# Patient Record
Sex: Male | Born: 1950 | Race: White | Hispanic: No | Marital: Single | Smoking: Never smoker
Health system: Southern US, Community
[De-identification: ages and names within clinical notes are randomized; demographics above are authoritative.]

## PROBLEM LIST (undated history)

## (undated) DIAGNOSIS — R739 Hyperglycemia, unspecified: Secondary | ICD-10-CM

## (undated) DIAGNOSIS — I10 Essential (primary) hypertension: Secondary | ICD-10-CM

## (undated) HISTORY — DX: Essential (primary) hypertension: I10

## (undated) HISTORY — DX: Hyperglycemia, unspecified: R73.9

---

## 2007-08-07 ENCOUNTER — Ambulatory Visit: Payer: Self-pay | Admitting: Family Medicine

## 2007-08-27 ENCOUNTER — Ambulatory Visit: Payer: Self-pay | Admitting: Family Medicine

## 2013-03-17 ENCOUNTER — Ambulatory Visit: Payer: Self-pay | Admitting: Internal Medicine

## 2013-07-13 ENCOUNTER — Encounter: Payer: Self-pay | Admitting: Internal Medicine

## 2013-07-13 ENCOUNTER — Ambulatory Visit (INDEPENDENT_AMBULATORY_CARE_PROVIDER_SITE_OTHER): Payer: 59 | Admitting: Internal Medicine

## 2013-07-13 ENCOUNTER — Encounter (INDEPENDENT_AMBULATORY_CARE_PROVIDER_SITE_OTHER): Payer: Self-pay

## 2013-07-13 VITALS — BP 178/98 | HR 89 | Temp 98.7°F | Ht 65.75 in | Wt 178.0 lb

## 2013-07-13 DIAGNOSIS — R7309 Other abnormal glucose: Secondary | ICD-10-CM

## 2013-07-13 DIAGNOSIS — Z1211 Encounter for screening for malignant neoplasm of colon: Secondary | ICD-10-CM | POA: Insufficient documentation

## 2013-07-13 DIAGNOSIS — R739 Hyperglycemia, unspecified: Secondary | ICD-10-CM

## 2013-07-13 DIAGNOSIS — Z125 Encounter for screening for malignant neoplasm of prostate: Secondary | ICD-10-CM

## 2013-07-13 DIAGNOSIS — R030 Elevated blood-pressure reading, without diagnosis of hypertension: Secondary | ICD-10-CM

## 2013-07-13 NOTE — Progress Notes (Signed)
  Subjective:    Patient ID: Andre Foster, male    DOB: [birth year], 62 y.o.   MRN: [id number]  HPI 62 year old male presents to establish care. He brings record from recent health screening at his wife's work which showed elevated A1c at 6.8% and elevated blood pressure. He has never been diagnosed or treated for diabetes. He has never been treated for elevated blood pressure. He reports he follows a healthy diet and exercises daily by running with his dog. He denies any symptoms of chest pain, palpitations, headache. He denies shortness of breath. He does have a family history of diabetes and heart disease.  No outpatient encounter prescriptions on file as of 07/13/2013.   No facility-administered encounter medications on file as of 07/13/2013.   BP 178/98  Pulse 89  Temp(Src) 98.7 F (37.1 C) (Oral)  Ht 5' 5.75" (1.67 m)  Wt 178 lb (80.74 kg)  BMI 28.95 kg/m2  SpO2 98%  Review of Systems  Constitutional: Negative for fever, chills, activity change, appetite change, fatigue and unexpected weight change.  Eyes: Negative for visual disturbance.  Respiratory: Negative for cough and shortness of breath.   Cardiovascular: Negative for chest pain, palpitations and leg swelling.  Gastrointestinal: Negative for abdominal pain and abdominal distention.  Genitourinary: Negative for dysuria, urgency and difficulty urinating.  Musculoskeletal: Negative for arthralgias and gait problem.  Skin: Negative for color change and rash.  Hematological: Negative for adenopathy.  Psychiatric/Behavioral: Negative for sleep disturbance and dysphoric mood. The patient is not nervous/anxious.        Objective:   Physical Exam  Constitutional: He is oriented to person, place, and time. He appears well-developed and well-nourished. No distress.  HENT:  Head: Normocephalic and atraumatic.  Right Ear: External ear normal.  Left Ear: External ear normal.  Nose: Nose normal.  Mouth/Throat: Oropharynx is  clear and moist. No oropharyngeal exudate.  Eyes: Conjunctivae and EOM are normal. Pupils are equal, round, and reactive to light. Right eye exhibits no discharge. Left eye exhibits no discharge. No scleral icterus.  Neck: Normal range of motion. Neck supple. No tracheal deviation present. No thyromegaly present.  Cardiovascular: Normal rate, regular rhythm and normal heart sounds.  Exam reveals no gallop and no friction rub.   No murmur heard. Pulmonary/Chest: Effort normal and breath sounds normal. No respiratory distress. He has no wheezes. He has no rales. He exhibits no tenderness.  Abdominal: Soft. Bowel sounds are normal. He exhibits no distension. There is no tenderness. There is no rebound.  Musculoskeletal: Normal range of motion. He exhibits no edema.  Lymphadenopathy:    He has no cervical adenopathy.  Neurological: He is alert and oriented to person, place, and time. No cranial nerve deficit. Coordination normal.  Skin: Skin is warm and dry. No rash noted. He is not diaphoretic. No erythema. No pallor.  Psychiatric: He has a normal mood and affect. His behavior is normal. Judgment and thought content normal.          Assessment & Plan:

## 2013-07-13 NOTE — Assessment & Plan Note (Signed)
BP Readings from Last 3 Encounters:  07/13/13 178/98   BP elevated today. Will have pt monitor at home and email with readings. Will check renal function with labs today. If persistently elevated >140/90, we discussed starting lisinopril.

## 2013-07-13 NOTE — Patient Instructions (Signed)
Please check blood pressure at home 1-2 times per week. Email me with readings.  Follow up 2-4 weeks.

## 2013-07-13 NOTE — Assessment & Plan Note (Signed)
Recent employee health screening showed elevated A1c of 6.8%. Will recheck fasting blood sugar and A1c with labs. Encouraged healthy diet, low in processed sugars. Encouraged continued regular physical activity.

## 2013-07-14 ENCOUNTER — Telehealth: Payer: Self-pay | Admitting: *Deleted

## 2013-07-14 DIAGNOSIS — R17 Unspecified jaundice: Secondary | ICD-10-CM

## 2013-07-14 LAB — COMPREHENSIVE METABOLIC PANEL
ALT: 14 IU/L (ref 0–44)
AST: 14 IU/L (ref 0–40)
Albumin/Globulin Ratio: 2.4 (ref 1.1–2.5)
Creatinine, Ser: 1.17 mg/dL (ref 0.76–1.27)
GFR calc Af Amer: 77 mL/min/{1.73_m2} (ref >59)
GFR calc non Af Amer: 66 mL/min/{1.73_m2} (ref >59)
Globulin, Total: 2 g/dL (ref 1.5–4.5)
Potassium: 4.7 mmol/L (ref 3.5–5.2)
Sodium: 139 mmol/L (ref 134–144)
Total Bilirubin: 1.9 mg/dL — ABNORMAL HIGH (ref 0.0–1.2)

## 2013-07-14 LAB — PSA, TOTAL AND FREE
PSA, Free Pct: 20.6 %
PSA, Free: 0.66 ng/mL

## 2013-07-14 LAB — LIPID PANEL
Chol/HDL Ratio: 6.5 ratio units — ABNORMAL HIGH (ref 0.0–5.0)
Cholesterol, Total: 240 mg/dL — ABNORMAL HIGH (ref 100–199)
LDL Calculated: 170 mg/dL — ABNORMAL HIGH (ref 0–99)
Triglycerides: 165 mg/dL — ABNORMAL HIGH (ref 0–149)
VLDL Cholesterol Cal: 33 mg/dL (ref 5–40)

## 2013-07-14 LAB — TSH: TSH: 3.78 u[IU]/mL (ref 0.450–4.500)

## 2013-07-14 LAB — MICROALBUMIN / CREATININE URINE RATIO
MICROALB/CREAT RATIO: 8.6 mg/g creat (ref 0.0–30.0)
Microalbumin, Urine: 13.2 ug/mL (ref 0.0–17.0)

## 2013-07-14 MED ORDER — ATORVASTATIN CALCIUM 20 MG PO TABS: 20.0000 mg | ORAL_TABLET | Freq: Every day | ORAL | Status: AC

## 2013-07-14 MED ORDER — METFORMIN HCL 500 MG PO TABS: 500.0000 mg | ORAL_TABLET | Freq: Two times a day (BID) | ORAL | Status: DC

## 2013-07-14 NOTE — Telephone Encounter (Signed)
Message copied by Theola Sequin on Wed Jul 14, 2013  9:49 AM ------      Message from: Ronna Polio A      Created: Wed Jul 14, 2013  7:12 AM       Labs show elevated blood sugars consistent with diabetes. I would like to have pt check blood sugars at home 1-2 times daily. We will need to call in a glucometer, test strips and lancets. Onetouch Ultra is fine. I would recommend that we start Metformin 500mg  twice daily. We will need to call this in as well.      Labs also show elevated cholesterol. I would like to start Atorvastatin 20mg  daily.             Finally, labs show elevated bilirubin. I would like to order RUQ ultrasound to evaluate the liver and pancreas. ------

## 2013-07-14 NOTE — Telephone Encounter (Signed)
Patient informed of results and he already has a glucometer at home, no need to send prescription for glucometer. Patient is ok with referral for U/S. Metformin and Atorvastatin sent to [facility]

## 2013-07-15 ENCOUNTER — Telehealth: Payer: Self-pay | Admitting: Internal Medicine

## 2013-07-15 ENCOUNTER — Ambulatory Visit: Payer: Self-pay | Admitting: Internal Medicine

## 2013-07-15 MED ORDER — LISINOPRIL 10 MG PO TABS: 10.0000 mg | ORAL_TABLET | Freq: Every day | ORAL | Status: DC

## 2013-07-15 NOTE — Telephone Encounter (Signed)
We can start Lisinopril 10mg  daily. #90 with 3 refills. He will need to have BMP in 1 week after starting this medication. He should monitor BP on the medication and email with readings.

## 2013-07-15 NOTE — Telephone Encounter (Signed)
Pt was recently seen and Dr. Darrick Huntsman wanted his b/p reading before she prescribed the medication. Pt came by and says his b/p reading is 186/96. Pt uses [facility] on [address].

## 2013-07-15 NOTE — Telephone Encounter (Signed)
Patient informed and verbalized understanding. Lab appt scheduled, patient aware it is a lab appointment to have labs drawn not to have his BP checked. Also need to check BP once a day or every other day.

## 2013-07-15 NOTE — Telephone Encounter (Signed)
I do not see a visit other than provider with in chart please advise.

## 2013-07-15 NOTE — Telephone Encounter (Signed)
Spoke with patient and he stated he took his BP while out and it was as listed below. Explained to patient usually patients monitor it for a couple days before getting medication treatment. However patient would like some BP medication since it was high today after he had his ultrasound and when he was here for his visit. Please advise.

## 2013-07-16 ENCOUNTER — Encounter: Payer: Self-pay | Admitting: Internal Medicine

## 2013-07-20 ENCOUNTER — Encounter: Payer: Self-pay | Admitting: Internal Medicine

## 2013-07-22 ENCOUNTER — Encounter: Payer: Self-pay | Admitting: Internal Medicine

## 2013-07-22 ENCOUNTER — Other Ambulatory Visit: Payer: Self-pay | Admitting: *Deleted

## 2013-07-22 ENCOUNTER — Telehealth: Payer: Self-pay | Admitting: *Deleted

## 2013-07-22 DIAGNOSIS — I10 Essential (primary) hypertension: Secondary | ICD-10-CM

## 2013-07-22 NOTE — Telephone Encounter (Signed)
BMP 401.9 

## 2013-07-22 NOTE — Telephone Encounter (Signed)
Pt is coming in for labs tomorrow 10.31.2014 what labs and dx?

## 2013-07-23 ENCOUNTER — Other Ambulatory Visit (INDEPENDENT_AMBULATORY_CARE_PROVIDER_SITE_OTHER): Payer: 59

## 2013-07-23 DIAGNOSIS — I10 Essential (primary) hypertension: Secondary | ICD-10-CM

## 2013-07-23 NOTE — Addendum Note (Signed)
Addended by: Montine Circle D on: 07/23/2013 11:40 AM   Modules accepted: Orders

## 2013-07-24 LAB — BASIC METABOLIC PANEL
BUN/Creatinine Ratio: 18 (ref 10–22)
CO2: 26 mmol/L (ref 18–29)
Calcium: 9.4 mg/dL (ref 8.6–10.2)
Chloride: 100 mmol/L (ref 97–108)
GFR calc Af Amer: 95 mL/min/{1.73_m2} (ref >59)
GFR calc non Af Amer: 82 mL/min/{1.73_m2} (ref >59)
Potassium: 4.3 mmol/L (ref 3.5–5.2)
Sodium: 141 mmol/L (ref 134–144)

## 2013-07-26 ENCOUNTER — Encounter: Payer: Self-pay | Admitting: Internal Medicine

## 2013-07-28 ENCOUNTER — Encounter: Payer: Self-pay | Admitting: Internal Medicine

## 2013-07-29 ENCOUNTER — Other Ambulatory Visit: Payer: Self-pay

## 2013-07-30 ENCOUNTER — Encounter: Payer: Self-pay | Admitting: Internal Medicine

## 2013-07-31 ENCOUNTER — Encounter: Payer: Self-pay | Admitting: Internal Medicine

## 2013-08-01 MED ORDER — LISINOPRIL 40 MG PO TABS: 40.0000 mg | ORAL_TABLET | Freq: Every day | ORAL | Status: DC

## 2013-08-06 ENCOUNTER — Encounter: Payer: Self-pay | Admitting: Internal Medicine

## 2013-08-12 ENCOUNTER — Other Ambulatory Visit (INDEPENDENT_AMBULATORY_CARE_PROVIDER_SITE_OTHER): Payer: 59

## 2013-08-12 ENCOUNTER — Telehealth: Payer: Self-pay | Admitting: *Deleted

## 2013-08-12 DIAGNOSIS — I10 Essential (primary) hypertension: Secondary | ICD-10-CM

## 2013-08-12 DIAGNOSIS — E785 Hyperlipidemia, unspecified: Secondary | ICD-10-CM

## 2013-08-12 NOTE — Telephone Encounter (Signed)
Actually, can you draw CMP and lipids 401.9 and 272.4

## 2013-08-12 NOTE — Telephone Encounter (Signed)
What labs and dx?  

## 2013-08-12 NOTE — Telephone Encounter (Signed)
BMP 401.9 

## 2013-08-13 LAB — COMPREHENSIVE METABOLIC PANEL
ALT: 20 IU/L (ref 0–44)
AST: 19 IU/L (ref 0–40)
Albumin/Globulin Ratio: 2.3 (ref 1.1–2.5)
Albumin: 4.6 g/dL (ref 3.6–4.8)
Alkaline Phosphatase: 74 IU/L (ref 39–117)
BUN/Creatinine Ratio: 24 — ABNORMAL HIGH (ref 10–22)
GFR calc Af Amer: 82 mL/min/{1.73_m2} (ref >59)
GFR calc non Af Amer: 71 mL/min/{1.73_m2} (ref >59)
Potassium: 4.6 mmol/L (ref 3.5–5.2)
Sodium: 139 mmol/L (ref 134–144)
Total Bilirubin: 1.2 mg/dL (ref 0.0–1.2)

## 2013-08-13 LAB — LIPID PANEL
Triglycerides: 104 mg/dL (ref 0–149)
VLDL Cholesterol Cal: 21 mg/dL (ref 5–40)

## 2013-09-14 ENCOUNTER — Encounter: Payer: Self-pay | Admitting: Internal Medicine

## 2013-10-28 ENCOUNTER — Other Ambulatory Visit: Payer: Self-pay | Admitting: *Deleted

## 2013-10-28 MED ORDER — METFORMIN HCL 500 MG PO TABS: 500.0000 mg | ORAL_TABLET | Freq: Two times a day (BID) | ORAL | Status: DC

## 2013-10-28 MED ORDER — LISINOPRIL 40 MG PO TABS: 40.0000 mg | ORAL_TABLET | Freq: Every day | ORAL | Status: AC

## 2013-11-05 ENCOUNTER — Telehealth: Payer: Self-pay | Admitting: Internal Medicine

## 2013-11-05 NOTE — Telephone Encounter (Signed)
I called and spoke with Mardy at [facility] in the billing department. I asked her to help me with the patients bill his acct number there is [id number]. It was for an ultra sound he had done on 07/15/2013. She is sending Dewayne Hatchnn an email with my contact information on it so we can discuss the dx codes.

## 2013-11-05 NOTE — Telephone Encounter (Signed)
I called to let patient know that I had contacted [facility] on his behalf and was waiting for them to get back with me.

## 2014-01-07 NOTE — Telephone Encounter (Signed)
Below is what I received from Ann at [facility] patient accounts: Please let me know if I need to send her anything else.  Hi Carollee HerterShannon,  I received an email from our phone team stating that you had concerns about the diagnosis code for the above patient.  The claim was coded as 277.4 - Ultrasound - RUQ pain - elevated bilirubin.  I double checked the image of the physicians order and thats exactly how it reads.  I hope this helps.  If you have any further questions, just let me know.  Thanks!  Milus GlazierAnn Shebester Ayr - Patient Accounts

## 2014-01-07 NOTE — Telephone Encounter (Signed)
No. I don't think any additional information is needed.

## 2014-02-19 ENCOUNTER — Other Ambulatory Visit: Payer: Self-pay | Admitting: Internal Medicine

## 2014-06-14 ENCOUNTER — Other Ambulatory Visit: Payer: Self-pay | Admitting: Internal Medicine

## 2014-06-16 NOTE — Telephone Encounter (Signed)
Mailed unread message to pt  

## 2015-04-11 ENCOUNTER — Other Ambulatory Visit: Payer: Self-pay | Admitting: Internal Medicine

## 2015-04-11 NOTE — Telephone Encounter (Signed)
Last OV 10.21.2014.  Please advise refill

## 2015-04-11 NOTE — Telephone Encounter (Signed)
Spoke with pt advised appoint was needed for medication refill.  Pt declined appoint.  He states his wife works for [employer] and he and his wife have their wellness visits done there and he does not want to come in for an appoint.  He states he just wants his BP medication.  I attempted to explain that for maintenance medications he should be seen at least every 6 months.

## 2015-04-11 NOTE — Telephone Encounter (Signed)
Must be seen at least once per year for ongoing BP medication.

## 2015-04-13 ENCOUNTER — Other Ambulatory Visit: Payer: Self-pay | Admitting: Internal Medicine

## 2015-10-12 IMAGING — US US ABDOMEN LIMITED SLG ORGAN/ASCITES
1 series · 14 of 25 positions shown · non-contrast
Comparison: none

REASON FOR EXAM: RUQ Pain Elevated Bilirubin
COMMENTS:

[Series 1: us abdomen limited slg organ/ascites · 0.28mm/px · 14 of 51 slices shown]
[im 1/51]
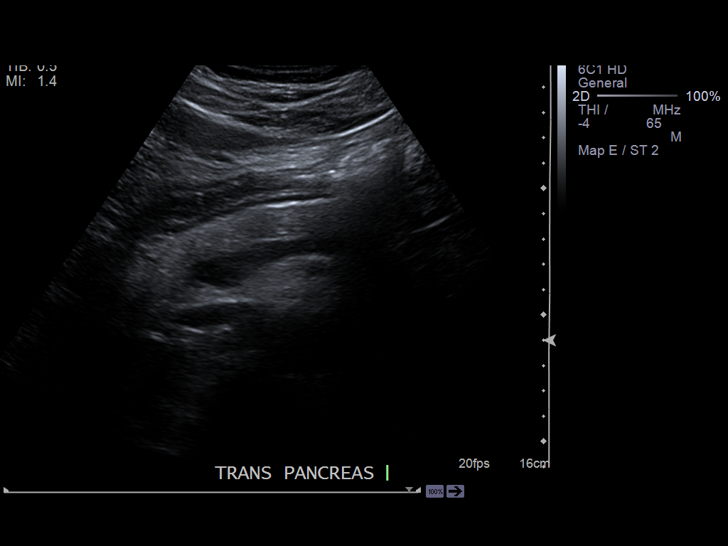
[im 5/51]
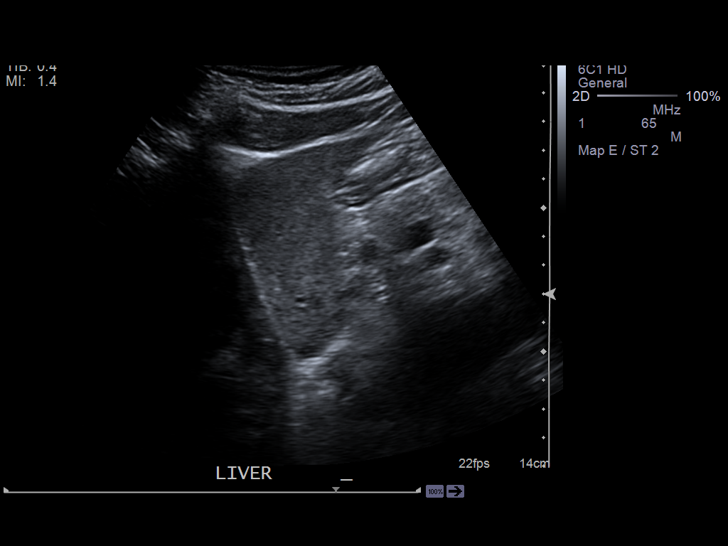
[im 9/51]
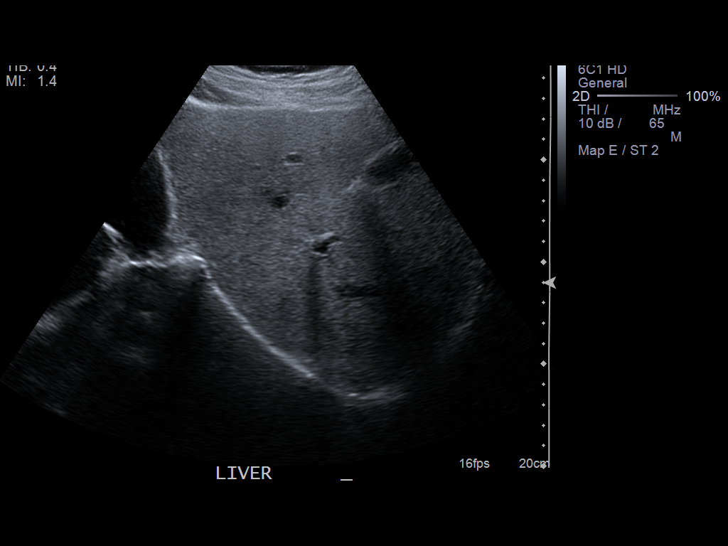
[im 13/51]
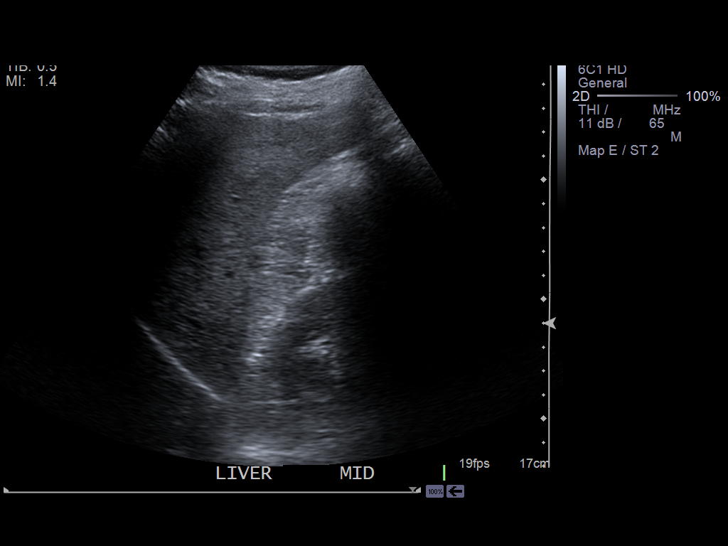
[im 17/51]
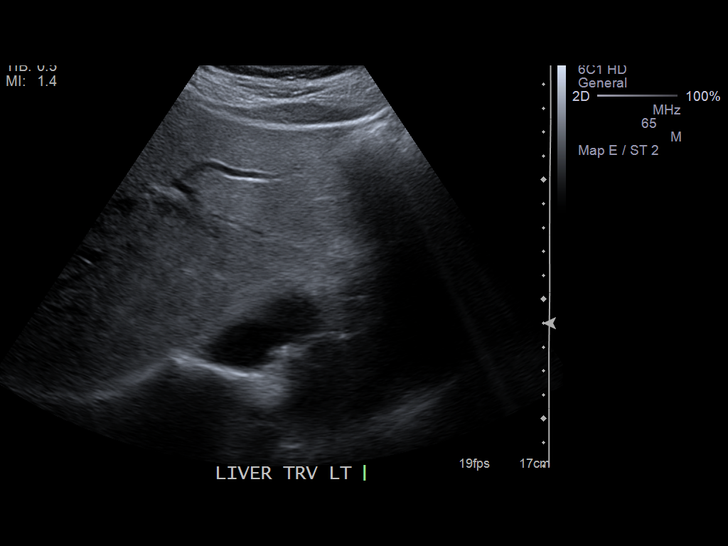
[im 19/51]
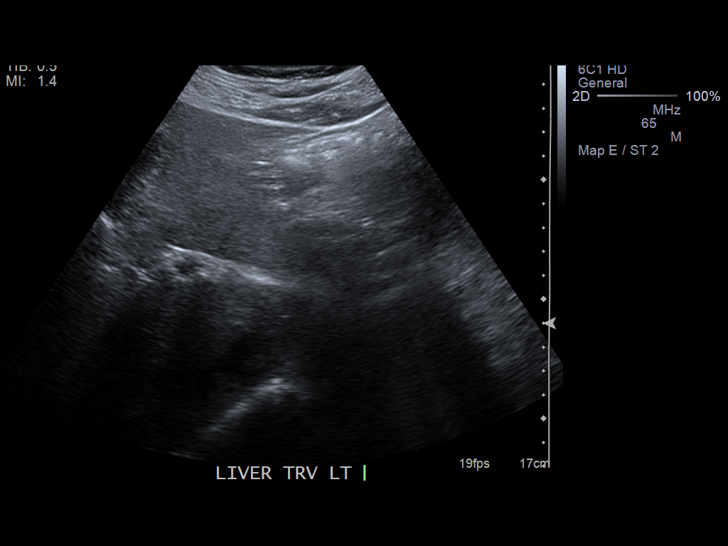
[im 23/51]
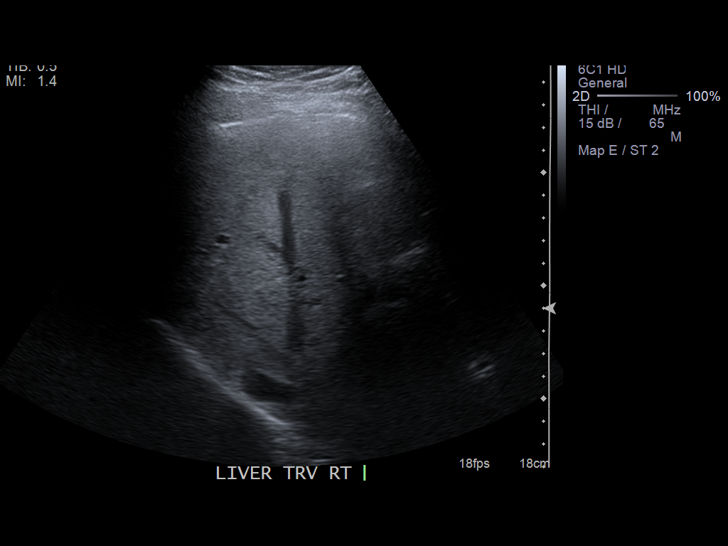
[im 28/51]
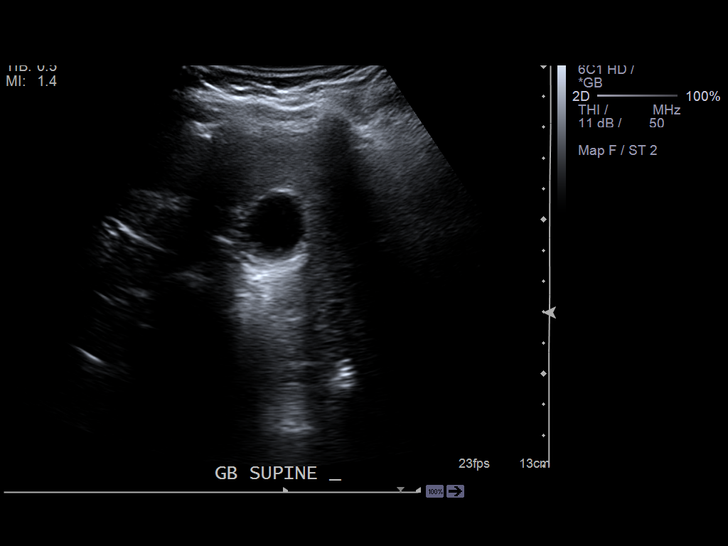
[im 32/51]
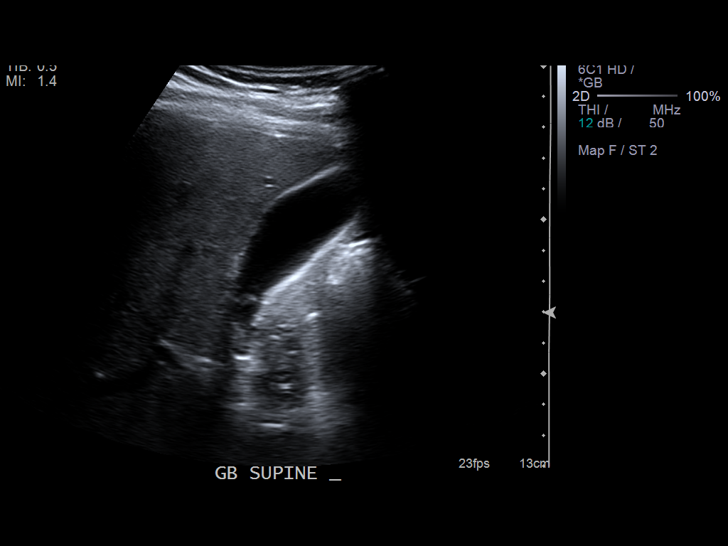
[im 34/51]
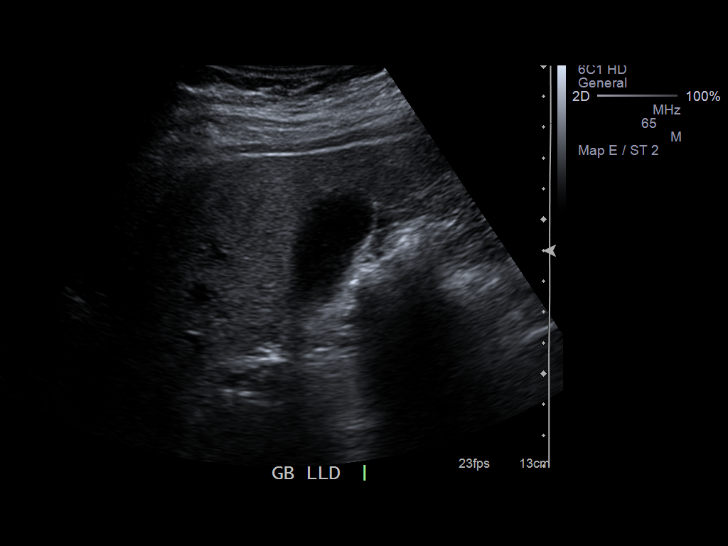
[im 38/51]
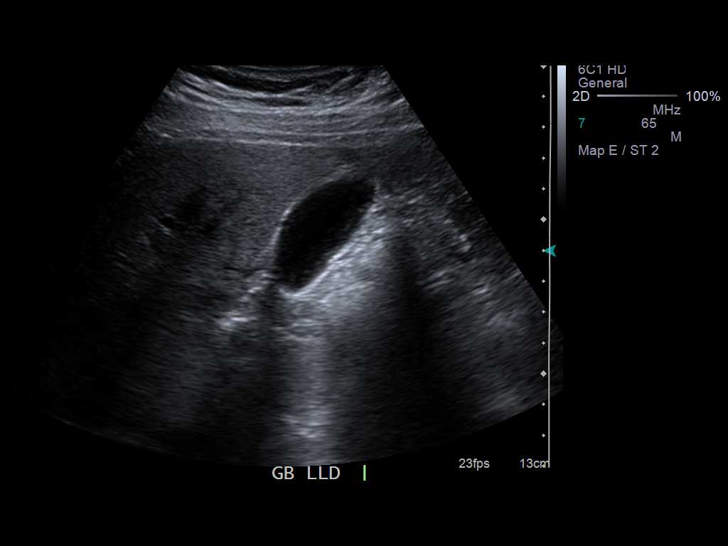
[im 42/51]
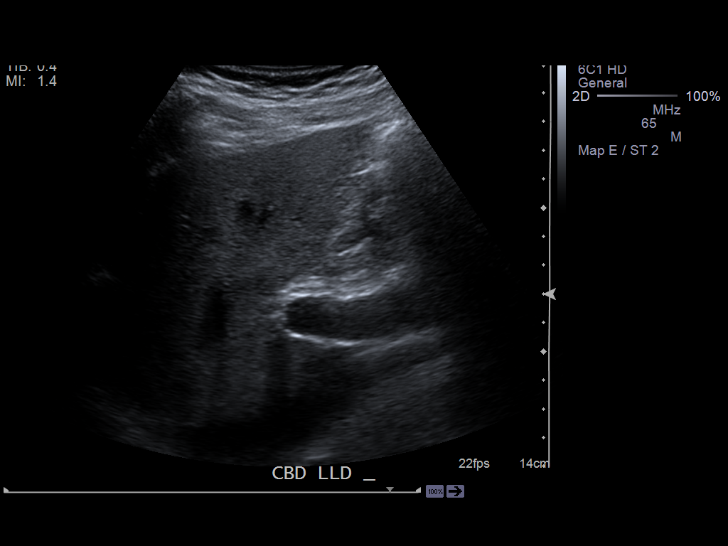
[im 46/51]
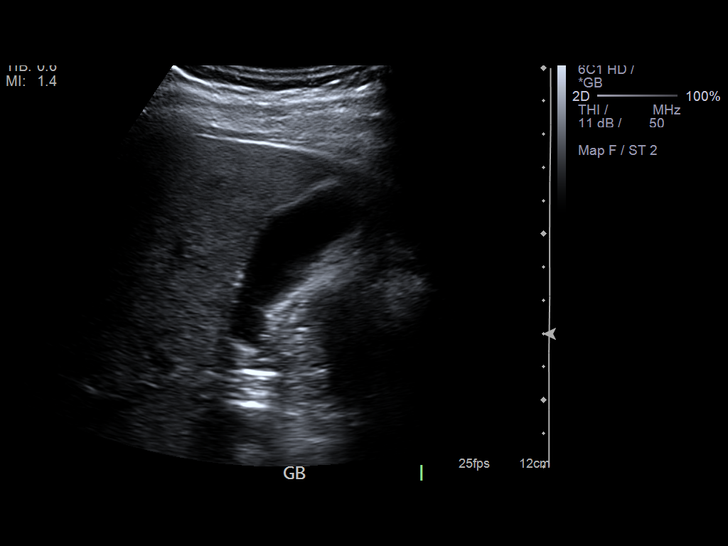
[im 51/51]
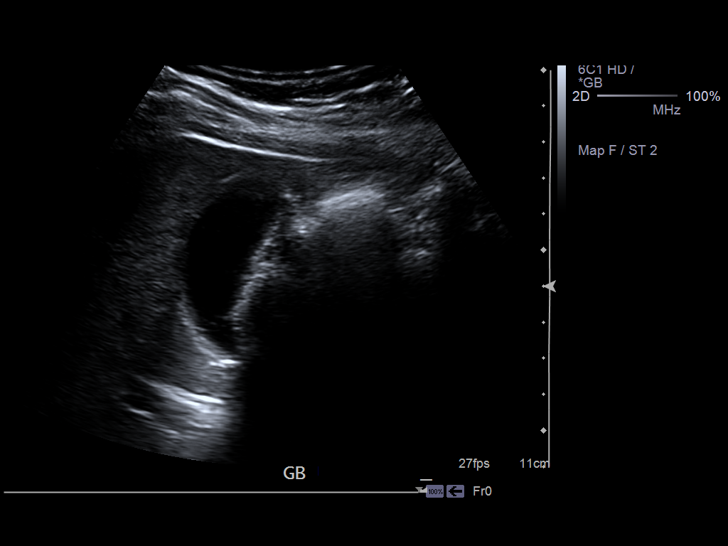

[14 of 25 positions shown; findings below may reference images not displayed]

PROCEDURE:     KOMOO - KOMOO ABDOMEN LTD 1 ORGAN OR QUAD  - July 15, 2013  [DATE]

RESULT:     A limited upper abdominal ultrasound examination was performed.

The liver exhibits normal echotexture with no focal mass nor ductal
dilation. Portal venous flow is normal in direction toward the liver.

The gallbladder is adequately distended with no evidence of stones, wall
thickening, or pericholecystic fluid. There is no positive sonographic
Murphy's sign. The common bile duct measures 2.2 mm in diameter.

Portions of the pancreas were obscured by bowel gas but the observed
portions of the head and body appear normal.
IMPRESSION: 1. Normal appearance of the liver, gallbladder, and common bile duct.
2. The observed portions of the pancreas also appear normal.

[REDACTED]

## 2016-10-11 ENCOUNTER — Telehealth: Payer: Self-pay | Admitting: *Deleted

## 2016-10-11 NOTE — Telephone Encounter (Signed)
Pt has not had labs since 2014. Recommendation on pt not wanting to re-establsih.

## 2016-10-11 NOTE — Telephone Encounter (Signed)
Patient has requested a medication refill for metformin  Pt has not re- established with another provider in the office, and hs no interest to re-establish at this time.  Pharmacy [facility]

## 2016-10-11 NOTE — Telephone Encounter (Signed)
Will not refill based on lack of labs and refusal to re-establish.

## 2016-10-11 NOTE — Telephone Encounter (Signed)
Pt was called and told that he would need to be seen and have labs. Pt did not wish to schedule.

## 2019-12-10 ENCOUNTER — Ambulatory Visit: Payer: Managed Care, Other (non HMO) | Attending: Internal Medicine

## 2019-12-10 DIAGNOSIS — Z23 Encounter for immunization: Secondary | ICD-10-CM

## 2019-12-10 NOTE — Progress Notes (Signed)
   Covid-19 Vaccination Clinic  Name:  Hani Campusano    MRN: [id number] DOB: 21-Dec-1950  12/10/2019  Mr. Emig was observed post Covid-19 immunization for 15 minutes without incident. He was provided with Vaccine Information Sheet and instruction to access the V-Safe system.   Mr. Brahm was instructed to call 911 with any severe reactions post vaccine: Marland Kitchen Difficulty breathing  . Swelling of face and throat  . A fast heartbeat  . A bad rash all over body  . Dizziness and weakness   Immunizations Administered    Name Date Dose VIS Date Route   Pfizer COVID-19 Vaccine 12/10/2019  8:37 AM 0.3 mL 09/03/2019 Intramuscular   Manufacturer: [employer], [employer]   Lot: [id number]   NDC: [address]-6

## 2020-01-04 ENCOUNTER — Ambulatory Visit: Payer: Managed Care, Other (non HMO) | Attending: Internal Medicine

## 2020-01-04 DIAGNOSIS — Z23 Encounter for immunization: Secondary | ICD-10-CM

## 2020-01-04 NOTE — Progress Notes (Signed)
   Covid-19 Vaccination Clinic  Name:  Andre Foster    MRN: [id number] DOB: Jan 25, 1951  01/04/2020  [name] was observed post Covid-19 immunization for 15 minutes without incident. He was provided with Vaccine Information Sheet and instruction to access the V-Safe system.   [name] was instructed to call 911 with any severe reactions post vaccine: Marland Kitchen Difficulty breathing  . Swelling of face and throat  . A fast heartbeat  . A bad rash all over body  . Dizziness and weakness   Immunizations Administered    Name Date Dose VIS Date Route   Pfizer COVID-19 Vaccine 01/04/2020  8:43 AM 0.3 mL 09/03/2019 Intramuscular   Manufacturer: [employer], [employer]   Lot: [id number]   NDC: [address]-4

## 2023-02-02 ENCOUNTER — Ambulatory Visit
Admission: RE | Admit: 2023-02-02 | Discharge: 2023-02-02 | Disposition: A | Discharge status: Home / Self Care | Payer: Managed Care, Other (non HMO) | Source: Ambulatory Visit | Attending: Urgent Care | Admitting: Urgent Care

## 2023-02-02 VITALS — BP 205/78 | HR 78 | Temp 97.9°F | Resp 18

## 2023-02-02 DIAGNOSIS — H6121 Impacted cerumen, right ear: Secondary | ICD-10-CM | POA: Diagnosis not present

## 2023-02-02 NOTE — Discharge Instructions (Addendum)
Follow-up with evaluation by an otolaryngologist (ENT) who will be able to remove your earwax using suction or other devices not available in urgent care.  I recommend establishing care and being seen annually for maintenance of your ear canal if this is a recurrent problem.  Please follow-up with your primary care provider regarding your very elevated blood pressure.

## 2023-02-02 NOTE — ED Provider Notes (Signed)
Andre Foster    CSN: [id number] Arrival date & time: 02/02/23  8295      History   Chief Complaint Chief Complaint  Patient presents with   Ear Fullness    HPI Andre Foster is a 72 y.o. male.    Ear Fullness    Presents to urgent care with complaint of bilateral ear fullness.  He is requesting irrigation to remove earwax.  He was at [facility] clinic who refused to perform irrigation.  Has attempted removal at home without improvement.  Past Medical History:  Diagnosis Date   Elevated blood sugar    Hypertension     Patient Active Problem List   Diagnosis Date Noted   Elevated blood sugar 07/13/2013   Elevated blood pressure 07/13/2013   Screening for prostate cancer 07/13/2013   Screening for colon cancer 07/13/2013    History reviewed. No pertinent surgical history.     Home Medications    Prior to Admission medications   Medication Sig Start Date End Date Taking? Authorizing Provider  atorvastatin (LIPITOR) 20 MG tablet Take 1 tablet (20 mg total) by mouth daily. 07/14/13   Shelia Media, MD  lisinopril (PRINIVIL,ZESTRIL) 40 MG tablet Take 1 tablet (40 mg total) by mouth daily. 10/28/13   Shelia Media, MD  metFORMIN (GLUCOPHAGE) 500 MG tablet Take 1 tablet (500 mg  total) by mouth 2 (two)  times daily with a meal. 06/14/14   Shelia Media, MD    Family History Family History  Problem Relation Age of Onset   Heart disease Mother        Heart Valve Replacement   Heart disease Father        s/p CABG   Diabetes Father     Social History Social History   Tobacco Use   Smoking status: Never   Smokeless tobacco: Never  Substance Use Topics   Alcohol use: No   Drug use: No     Allergies   Patient has no known allergies.   Review of Systems Review of Systems   Physical Exam Triage Vital Signs ED Triage Vitals  Enc Vitals Group     BP 02/02/23 0955 (!) 208/78     Pulse Rate 02/02/23 0955 78     Resp 02/02/23  0955 18     Temp 02/02/23 0955 97.9 F (36.6 C)     Temp Source 02/02/23 0955 Oral     SpO2 02/02/23 0955 97 %     Weight --      Height --      Head Circumference --      Peak Flow --      Pain Score 02/02/23 1004 0     Pain Loc --      Pain Edu? --      Excl. in GC? --    No data found.  Updated Vital Signs BP (!) 208/78 (BP Location: Left Arm)   Pulse 78   Temp 97.9 F (36.6 C) (Oral)   Resp 18   SpO2 97%   Visual Acuity Right Eye Distance:   Left Eye Distance:   Bilateral Distance:    Right Eye Near:   Left Eye Near:    Bilateral Near:     Physical Exam HENT:     Right Ear: There is impacted cerumen.     Left Ear: Tympanic membrane and ear canal normal.  Neurological:     General: No focal deficit present.  Mental Status: He is oriented to person, place, and time.  Psychiatric:        Mood and Affect: Mood normal.        Behavior: Behavior normal.      UC Treatments / Results  Labs (all labs ordered are listed, but only abnormal results are displayed) Labs Reviewed - No data to display  EKG   Radiology No results found.  Procedures Procedures (including critical care time)  Medications Ordered in UC Medications - No data to display  Initial Impression / Assessment and Plan / UC Course  I have reviewed the triage vital signs and the nursing notes.  Pertinent labs & imaging results that were available during my care of the patient were reviewed by me and considered in my medical decision making (see chart for details).   R EAC is occluded by excessive cerumen. TM is not visible. L TM and EAC are WNL. Attempting irrigation of R EAC to remove cerumen.  Incomplete evacuation of the EAC using irrigation.  The EAC is sensitive and unable to remove using curette.  Cerumen is deep in the canal and very "sticky".  Recommending patient be seen by otolaryngologist for earwax removal.  Suggesting he establish care and be seen annually.  Reviewed  chart history.   Counseled patient on potential for adverse effects with medications prescribed/recommended today, ER and return-to-clinic precautions discussed, patient verbalized understanding and agreement with care plan.  Final Clinical Impressions(s) / UC Diagnoses   Final diagnoses:  None   Discharge Instructions   None    ED Prescriptions   None    PDMP not reviewed this encounter.   Theodor, Crowder, FNP 02/02/23 1057

## 2023-02-02 NOTE — ED Triage Notes (Signed)
Patient presents to [facility] for bilateral ear fullness. State his PCP would not perform ear irrigation for wax removal. He has attempted OTC ear wax removal with no improvement.

## 2023-12-01 ENCOUNTER — Other Ambulatory Visit (HOSPITAL_COMMUNITY): Payer: Self-pay

## 2023-12-02 LAB — COLOGUARD: COLOGUARD: NEGATIVE

## 2023-12-02 LAB — EXTERNAL GENERIC LAB PROCEDURE: COLOGUARD: NEGATIVE

## 2024-03-16 ENCOUNTER — Telehealth (HOSPITAL_COMMUNITY): Payer: Self-pay | Admitting: *Deleted

## 2024-03-16 NOTE — Telephone Encounter (Signed)
 Will defer OP MBS to [facility]. RKEEL

## 2024-03-17 ENCOUNTER — Other Ambulatory Visit: Payer: Self-pay | Admitting: Family Medicine

## 2024-03-17 DIAGNOSIS — Z03822 Encounter for observation for suspected aspirated (inhaled) foreign body ruled out: Secondary | ICD-10-CM

## 2024-03-19 ENCOUNTER — Ambulatory Visit
Admission: RE | Admit: 2024-03-19 | Discharge: 2024-03-19 | Disposition: A | Discharge status: Home / Self Care | Source: Ambulatory Visit | Attending: Family Medicine | Admitting: Family Medicine

## 2024-03-19 DIAGNOSIS — Z03822 Encounter for observation for suspected aspirated (inhaled) foreign body ruled out: Secondary | ICD-10-CM | POA: Diagnosis present

## 2024-03-19 NOTE — Procedures (Signed)
 Modified Barium Swallow Study  Patient Details  Name: Tyshun Nobles MRN: [id number] Date of Birth: [birth year]  Today's Date: 03/19/2024  Modified Barium Swallow completed.  Full report located under Chart Review in the Imaging Section.  History of Present Illness Steve Rehfeldt is a 73 y.o. 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 MRI revealed 1. Acute right paramedian pontine infarct. 2. Chronic left paramedian pontine infarct. Pt passed a bedside swallow evaluation on 02/20/2024 and discharged home on regular with thin liquids.   Clinical Impression Pt reports that he currently receives [facility] services for swallowing. His HH SLP requested Modified Barium Swallow Study.   Pt appears to be a good historian. He reports coughing with water but it has gotten better in the last week, and reduced coughing when drinking milk and smoothies, no coughing when I eat food. Pt's sister also reports historical struggle with taking tablets - he crushed several medicines prior to stroke d/t personal preference not dysphagia.       He presents with mild pharyngeal phase dysphagia resulting in sensed aspiration of thin liquids x 1 (out of 8 boluses including consecutive sips) and when consuming thin liquids with barium tablet. Pt with swallow initiation at the pyriform sinuses that resulted in aspiration as swallow  was initiated. Pt was sensate to aspiration and all aspirates were cleared with spontaneous cough. Pt's pharyngeal strength appeared functional with only a coating of barium on BOT, vallecula and pyriform sinuses. Education and video reviewed with pt and his sister. At this time, continue to recommend regular diet with thin liquids, medicine whole with applesauce and general aspiration precautions. This Clinical research associate called and spoke with pt's HHST. Information shared on results of this study, diet recommendation, would initiate BOT strengthening and additional trials of mixed  consistencies might be beneficial. Don't recommend a repeat Modified Barium Swallow Study unless pt has a respiratory decline.  Factors that may increase risk of adverse event in presence of aspiration Noe & Lianne 2021):  N/A  Swallow Evaluation Recommendations Recommendations: PO diet PO Diet Recommendation: Regular;Thin liquids (Level 0) Liquid Administration via: Straw Medication Administration: Whole meds with puree Supervision: Patient able to self-feed Swallowing strategies  : Minimize environmental distractions;Slow rate;Small bites/sips Postural changes: Position pt fully upright for meals;Stay upright 30-60 min after meals Oral care recommendations: Oral care BID (2x/day)     Estell Dillinger B. Rubbie, M.S., CCC-SLP, Tree surgeon Certified Brain Injury Specialist [facility] Services Office [phone] Ascom [phone] Fax [phone]
